# Patient Record
Sex: Male | Born: 1990 | Race: Black or African American | Hispanic: No | Marital: Single | Smoking: Current every day smoker
Health system: Southern US, Community
[De-identification: ages and names within clinical notes are randomized; demographics above are authoritative.]

---

## 2018-10-03 ENCOUNTER — Emergency Department (HOSPITAL_COMMUNITY): Payer: Self-pay

## 2018-10-03 ENCOUNTER — Emergency Department (HOSPITAL_COMMUNITY)
Admission: EM | Admit: 2018-10-03 | Discharge: 2018-10-03 | Disposition: A | Discharge status: Home / Self Care | Payer: Self-pay | Attending: Emergency Medicine | Admitting: Emergency Medicine

## 2018-10-03 ENCOUNTER — Encounter (HOSPITAL_COMMUNITY): Payer: Self-pay

## 2018-10-03 DIAGNOSIS — Z202 Contact with and (suspected) exposure to infections with a predominantly sexual mode of transmission: Secondary | ICD-10-CM | POA: Insufficient documentation

## 2018-10-03 DIAGNOSIS — N509 Disorder of male genital organs, unspecified: Secondary | ICD-10-CM | POA: Insufficient documentation

## 2018-10-03 DIAGNOSIS — N5089 Other specified disorders of the male genital organs: Secondary | ICD-10-CM

## 2018-10-03 DIAGNOSIS — M545 Low back pain, unspecified: Secondary | ICD-10-CM

## 2018-10-03 DIAGNOSIS — Z7689 Persons encountering health services in other specified circumstances: Secondary | ICD-10-CM

## 2018-10-03 DIAGNOSIS — F17200 Nicotine dependence, unspecified, uncomplicated: Secondary | ICD-10-CM | POA: Insufficient documentation

## 2018-10-03 LAB — URINALYSIS, ROUTINE W REFLEX MICROSCOPIC
Bacteria, UA: NONE SEEN
Bilirubin Urine: NEGATIVE
Glucose, UA: NEGATIVE mg/dL
Hgb urine dipstick: NEGATIVE
Ketones, ur: NEGATIVE mg/dL
Nitrite: NEGATIVE
Protein, ur: NEGATIVE mg/dL
Specific Gravity, Urine: 1.026 (ref 1.005–1.030)
pH: 7 (ref 5.0–8.0)

## 2018-10-03 MED ORDER — AZITHROMYCIN 250 MG PO TABS
1000.0000 mg | ORAL_TABLET | Freq: Once | ORAL | Status: AC
  Administered 2018-10-03: 1000 mg via ORAL
  Filled 2018-10-03: qty 4

## 2018-10-03 MED ORDER — KETOROLAC TROMETHAMINE 15 MG/ML IJ SOLN
15.0000 mg | Freq: Once | INTRAMUSCULAR | Status: AC
  Administered 2018-10-03: 15 mg via INTRAVENOUS
  Filled 2018-10-03: qty 1

## 2018-10-03 MED ORDER — METHOCARBAMOL 500 MG PO TABS: 500.0000 mg | ORAL_TABLET | Freq: Two times a day (BID) | ORAL | 0 refills | Status: AC

## 2018-10-03 MED ORDER — CEFTRIAXONE SODIUM 250 MG IJ SOLR
250.0000 mg | Freq: Once | INTRAMUSCULAR | Status: AC
  Administered 2018-10-03: 250 mg via INTRAMUSCULAR
  Filled 2018-10-03: qty 250

## 2018-10-03 MED ORDER — STERILE WATER FOR INJECTION IJ SOLN
INTRAMUSCULAR | Status: AC
  Administered 2018-10-03: 10 mL
  Filled 2018-10-03: qty 10

## 2018-10-03 NOTE — Discharge Instructions (Signed)
You have been diagnosed today with acute right-sided lower back pain, mass of the left testicle, concern for STD exposure.  At this time there does not appear to be the presence of an emergent medical condition, however there is always the potential for conditions to change. Please read and follow the below instructions.  Please return to the Emergency Department immediately for any new or worsening symptoms. Please be sure to follow up with your Primary Care Provider within one week regarding your visit today; please call their office to schedule an appointment even if you are feeling better for a follow-up visit. You have been treated with a medication today called Toradol this is an NSAID medication, please avoid further NSAIDs including ibuprofen, naproxen, Aleve, Advil for the next 2 days and drink plenty of water.  You may use the muscle relaxer Robaxin as prescribed to help with your muscle spasm, do not drive or operate machinery while taking Robaxin as will make you drowsy.  Do not drink alcohol or take other sedating medications with Robaxin as this will worsen side effects. Your ultrasound today showed a mass on your left testicle, please call the specialist at [facility] urology tomorrow to schedule a follow-up appointment, you will need a repeat ultrasound in approximately 1 month.  Call their office tomorrow to schedule a follow-up appointment. You have been treated presumptively today for gonorrhea and chlamydia. You have been tested today for gonorrhea and chlamydia as well as HIV and syphilis. These results will be available in approximately 3 days. You may check your MyChart account for results. Please inform all sexual partners of positive results and that they should be tested and treated as well. Please wait 2 weeks and be sure that you and your partners are symptom free before returning to sexual activity. Please use protection with every sexual encounter. Follow Up: Please followup with  your primary doctor in 3 days for discussion of your diagnoses and further evaluation after today's visit; if you do not have a primary care doctor use the resource guide provided to find one; Please return to the ER for worsening symptoms, high fevers or persistent vomiting.  Get help right away if: You develop new bowel or bladder control problems. You have unusual weakness or numbness in your arms or legs. You develop nausea or vomiting. You develop abdominal pain. You feel faint. You testicular pain or swelling Any new/concerning or worsening symptoms   Please read the additional information packets attached to your discharge summary.  Do not take your medicine if  develop an itchy rash, swelling in your mouth or lips, or difficulty breathing; call 911 and seek immediate emergency medical attention if this occurs.

## 2018-10-03 NOTE — ED Provider Notes (Signed)
[facility]-EMERGENCY DEPT Provider Note   CSN: [id number] Arrival date & time: 10/03/18  1129    History   Chief Complaint Chief Complaint  Patient presents with  . Back Pain    HPI Juan Price is a 28 y.o. male otherwise healthy presenting today with multiple complaints.  Patient's initial complaint is right-sided lower back pain that began this morning after he got out of bed.  Patient reports after rolling out of bed he had a throbbing right lower back pain constant severe worsened with movement and palpation and improved still.  Patient reports he has had similar pain in the past.  He denies any injury/trauma.  He denies any fever/chills, IV drug use, saddle area paresthesias, history of cancer, urinary retention, bowel/bladder incontinence or radiation of pain.  He denies any dysuria/hematuria or any additional concerns related to his back pain. - Patient's second complaint today is a lump that he found on his left testicle he states that he first noticed it 3-4 days ago and has never felt it in the past he states that is not painful to touch, he denies any injury or trauma of this area.  He denies any dysuria/hematuria or swelling of the scrotum.  Patient would like to be tested for STDs today as well.    HPI  History reviewed. No pertinent past medical history.  There are no active problems to display for this patient.   History reviewed. No pertinent surgical history.      Home Medications    Prior to Admission medications   Medication Sig Start Date End Date Taking? Authorizing Provider  methocarbamol (ROBAXIN) 500 MG tablet Take 1 tablet (500 mg total) by mouth 2 (two) times daily. 10/03/18   Bill SalinasMorelli, Sincere Berlanga A, PA-C    Family History History reviewed. No pertinent family history.  Social History Social History   Tobacco Use  . Smoking status: Current Every Day Smoker    Packs/day: 0.50  . Smokeless tobacco: Never Used  Substance  Use Topics  . Alcohol use: Not Currently  . Drug use: Not Currently     Allergies   Iodine and Shellfish allergy   Review of Systems Review of Systems  Constitutional: Negative.  Negative for chills and fever.  Genitourinary: Positive for scrotal swelling (Lump attached to left testicle). Negative for discharge, dysuria, hematuria and testicular pain.  Musculoskeletal: Positive for back pain. Negative for joint swelling, neck pain and neck stiffness.  Skin: Negative.  Negative for rash.  Neurological: Negative.  Negative for syncope, weakness, numbness and headaches.       Denies saddle area paresthesias Denies bowel/bladder incontinence Denies urinary retention  All other systems reviewed and are negative.    Physical Exam Updated Vital Signs BP 126/79 (BP Location: Right Arm)   Pulse (!) 50   Temp 98.6 F (37 C) (Oral)   Resp 16   Ht 6\' 1"  (1.854 m)   Wt 97.5 kg   SpO2 99%   BMI 28.37 kg/m   Physical Exam Constitutional:      General: He is not in acute distress.    Appearance: Normal appearance. He is not ill-appearing or diaphoretic.  HENT:     Head: Normocephalic and atraumatic. No raccoon eyes or Battle's sign.     Jaw: There is normal jaw occlusion. No trismus.     Right Ear: External ear normal.     Left Ear: External ear normal.     Nose: Rhinorrhea present. Rhinorrhea is  clear.     Right Nostril: No epistaxis.     Left Nostril: No epistaxis.     Mouth/Throat:     Mouth: Mucous membranes are moist.     Pharynx: Oropharynx is clear.  Eyes:     General: Vision grossly intact. Gaze aligned appropriately.     Extraocular Movements: Extraocular movements intact.     Conjunctiva/sclera: Conjunctivae normal.     Pupils: Pupils are equal, round, and reactive to light.  Neck:     Musculoskeletal: Normal range of motion and neck supple. No neck rigidity.     Trachea: Trachea and phonation normal. No tracheal tenderness or tracheal deviation.  Cardiovascular:      Rate and Rhythm: Normal rate and regular rhythm.     Pulses:          Radial pulses are 2+ on the right side and 2+ on the left side.       Dorsalis pedis pulses are 2+ on the right side and 2+ on the left side.     Heart sounds: Normal heart sounds.  Pulmonary:     Effort: Pulmonary effort is normal. No respiratory distress.     Breath sounds: Normal breath sounds and air entry. No decreased breath sounds or rhonchi.  Chest:     Chest wall: No deformity, tenderness or crepitus.  Abdominal:     General: Bowel sounds are normal. There is no distension.     Palpations: Abdomen is soft. There is no pulsatile mass.     Tenderness: There is no abdominal tenderness. There is no right CVA tenderness, left CVA tenderness, guarding or rebound.  Genitourinary:    Comments: Chaperone present during genital exam Martin NT.  No external genital lesions noted, no bumps on head of penis, specifically no vesicles concerning for herpes or chancre suggestive of syphilis.  No pain with palpation of the penis/glans, no discharge or urethritis noted.  Scrotum and testicles without erythema/swelling or tenderness to palpation. Small nodule present to left superior testicle. Cremasteric reflex intact bilaterally. No palpable hernia noted.  Musculoskeletal:       Back:     Comments: No midline C/T/L spinal tenderness to palpation no deformity, crepitus, or step-off noted. No sign of injury to the neck or back.  Muscle spasm noted right lower back, tender to palpation  Feet:     Right foot:     Protective Sensation: 3 sites tested. 3 sites sensed.     Left foot:     Protective Sensation: 3 sites tested. 3 sites sensed.  Skin:    General: Skin is warm and dry.     Capillary Refill: Capillary refill takes less than 2 seconds.  Neurological:     Mental Status: He is alert.     GCS: GCS eye subscore is 4. GCS verbal subscore is 5. GCS motor subscore is 6.     Comments: Speech is clear and goal  oriented, follows commands Major Cranial nerves without deficit, no facial droop Normal strength in upper and lower extremities bilaterally including dorsiflexion and plantar flexion, strong and equal grip strength DTRs 2+ bilateral patella, no clonus of the feet Sensation normal to light and sharp touch Moves extremities without ataxia, coordination intact Normal gait  Psychiatric:        Behavior: Behavior is cooperative.    ED Treatments / Results  Labs (all labs ordered are listed, but only abnormal results are displayed) Labs Reviewed  URINALYSIS, ROUTINE W REFLEX MICROSCOPIC -  Abnormal; Notable for the following components:      Result Value   Leukocytes,Ua TRACE (*)    All other components within normal limits  RPR  HIV ANTIBODY (ROUTINE TESTING W REFLEX)  GC/CHLAMYDIA PROBE AMP ([place]    EKG None  Radiology Dg Lumbar Spine Complete  Result Date: 10/03/2018 CLINICAL DATA:  Low back and right flank pain for 1 week. The patient reports a knot in the region of pain. No known injury. EXAM: LUMBAR SPINE - COMPLETE 4+ VIEW COMPARISON:  None. FINDINGS: There is no evidence of lumbar spine fracture. Alignment is normal. Intervertebral disc spaces are maintained. IMPRESSION: Normal exam. Electronically Signed   By: Drusilla Kannerhomas  Dalessio M.D.   On: 10/03/2018 12:33   [place] Scrotum W/doppler  Result Date: 10/03/2018 CLINICAL DATA:  Left testicular mass EXAM: SCROTAL ULTRASOUND DOPPLER ULTRASOUND OF THE TESTICLES TECHNIQUE: Complete ultrasound examination of the testicles, epididymis, and other scrotal structures was performed. Color and spectral Doppler ultrasound were also utilized to evaluate blood flow to the testicles. COMPARISON:  None. FINDINGS: Right testicle Measurements: 4.7 x 2.1 x 2.6 cm. No mass or microlithiasis visualized. Left testicle Measurements: 4.1 x 1.7 x 2.7 cm. No mass or microlithiasis visualized. Right epididymis:  Normal in size and appearance. Left  epididymis: 2.8 x 1.3 x 1.3 cm extratesticular heterogeneous soft tissue mass along the posterior margin of the testicle, adjacent to versus contiguous with the epididymal tail. Differential considerations include leiomyoma, epididymal adenomatoid tumor, or fibrous pseudotumor versus less likely malignant lesion. Hydrocele:  Bilateral hydroceles, left greater than right. Varicocele:  None visualized. Pulsed Doppler interrogation of both testes demonstrates normal low resistance arterial and venous waveforms bilaterally. IMPRESSION: 1. 2.8 x 1.3 x 1.3 cm extratesticular heterogeneous soft tissue mass along the posterior margin of the testicle, adjacent to versus contiguous with the epididymal tail. Differential considerations include leiomyoma, epididymal adenomatoid tumor, or fibrous pseudotumor versus less likely malignant lesion. Urology consultation recommended. 2. No testicular torsion. Electronically Signed   By: Elige KoHetal  Patel   On: 10/03/2018 14:32    Procedures Procedures (including critical care time)  Medications Ordered in ED Medications  sterile water (preservative free) injection (has no administration in time range)  ketorolac (TORADOL) 15 MG/ML injection 15 mg (15 mg Intravenous Given 10/03/18 1329)  azithromycin (ZITHROMAX) tablet 1,000 mg (1,000 mg Oral Given 10/03/18 1602)  cefTRIAXone (ROCEPHIN) injection 250 mg (250 mg Intramuscular Given 10/03/18 1602)     Initial Impression / Assessment and Plan / ED Course  I have reviewed the triage vital signs and the nursing notes.  Pertinent labs & imaging results that were available during my care of the patient were reviewed by me and considered in my medical decision making (see chart for details).  Clinical Course as of Oct 02 1601  Tue Oct 03, 2018  1459 Outpatient follow-up with Urology; repeat US in one month.    [BM]    Clinical Course User Index [BM] Bill SalinasMorelli, Sharalyn Lomba A, PA-C   Juan Price is a 28 y.o. male presenting  with right-sided back pain.  He describes as his typical back pain that he has had for multiple years. Patient denies history of trauma, fever, IV drug use, night sweats, weight loss, cancer, saddle anesthesia, urinary rentention, bowel/bladder incontinence. No neurological deficits and normal neuro exam.  Patient with palpable muscle spasm of the right lower back, suspect musculoskeletal etiology of patient's pain. Pain is consistently reproducible with palpation of the back musculature. Abdomen  soft/nontender and without pulsatile mass. Patient with equal pedal pulses. Doubt spinal epidural abscess, cauda equina or AAA or other emergent etiologies at this time.  Imaging of right lower back obtained due to patient concern and duration pain as it has been intermittent for multiple years.  DG Lumbar:  IMPRESSION:  Normal exam.   Patient treated with Toradol 15 mg intramuscular, denies CKD or gastric ulcers, reports improvement of pain.  Will prescribe Robaxin 500 mg twice daily for muscular spasm, encourage warm compresses.  Patient has been advised on muscle relaxer precautions and he states understanding. Patient is ambulatory in the emergency department without assistance. RICE protocol and pain medicine indicated and discussed with patient.  - As to patient's left testicular lump, it is painless and ultrasound with Doppler reveals demonstrates normal low resistance arterial and venous waveforms bilaterally. IMPRESSION: 1. 2.8 x 1.3 x 1.3 cm extratesticular heterogeneous soft tissue mass along the posterior margin of the testicle, adjacent to versus contiguous with the epididymal tail. Differential considerations include leiomyoma, epididymal adenomatoid tumor, or fibrous pseudotumor versus less likely malignant lesion. Urology consultation recommended. 2. No testicular torsion.  I discussed these findings with on-call urologist who advises patient to call their office tomorrow to schedule a follow-up  appointment, patient will need repeat ultrasound in approximately 1 month.  Referral and contact information given for [facility]. - Finally patient requested to be tested for STDs today, HIV/syphilis and GC chlamydia urine probe were obtained, and he is asymptomatic and denies any recent sexual contacts in the last 1-2 months request treatment.  Urinalysis shows 11-20 white blood cells and trace leukocytes, no trichomonas seen.  Will treat with Rocephin and azithromycin and encourage PCP follow-up. Patient advised to inform and treat all sexual partners.  Pt advised on safe sex practices and understands that they have GC/Chlamydia cultures pending and will result in 2-3 days. HIV and RPR sent. Patient encouraged to follow up at local health department for future STI checks. No concern for prostatitis or epididymitis at this time. Discussed return precautions. Pt appears safe for discharge.  - At this time there does not appear to be any evidence of an acute emergency medical condition and the patient appears stable for discharge with appropriate outpatient follow up. Diagnosis was discussed with patient who verbalizes understanding of care plan and is agreeable to discharge. I have discussed return precautions with patient who verbalizes understanding of return precautions. Patient encouraged to follow-up with their PCP and urology. All questions answered.  Note: Portions of this report may have been transcribed using voice recognition software. Every effort was made to ensure accuracy; however, inadvertent computerized transcription errors may still be present. Final Clinical Impressions(s) / ED Diagnoses   Final diagnoses:  Acute right-sided low back pain without sciatica  Mass of left testicle  Encounter for assessment of sexually transmitted disease exposure    ED Discharge Orders         Ordered    methocarbamol (ROBAXIN) 500 MG tablet  2 times daily     10/03/18 1534            Elizabeth PalauMorelli, Syona Wroblewski A, PA-C 10/03/18 1613    Pricilla LovelessGoldston, Scott, MD 10/03/18 2155

## 2018-10-03 NOTE — ED Triage Notes (Signed)
Patient arrived via [facility] from home.    C/o of knott on lower mid back after waking up this morning.  10/10 pain      Hx. Pinched nerve.   Per ems "some what ambulatory due to pain".

## 2018-10-03 NOTE — ED Notes (Signed)
Bed: WTR6 Expected date:  Expected time:  Means of arrival:  Comments: 

## 2018-10-04 LAB — RPR: RPR Ser Ql: NONREACTIVE

## 2018-10-04 LAB — HIV ANTIBODY (ROUTINE TESTING W REFLEX): HIV Screen 4th Generation wRfx: NONREACTIVE

## 2019-12-31 IMAGING — CR LUMBAR SPINE - COMPLETE 4+ VIEW
5 series · 5 of 5 positions shown · non-contrast
Comparison: None.

CLINICAL DATA: Low back and right flank pain for 1 week. The
patient reports a knot in the region of pain. No known injury.

EXAM:
LUMBAR SPINE - COMPLETE 4+ VIEW

[t lumbar spine ap]
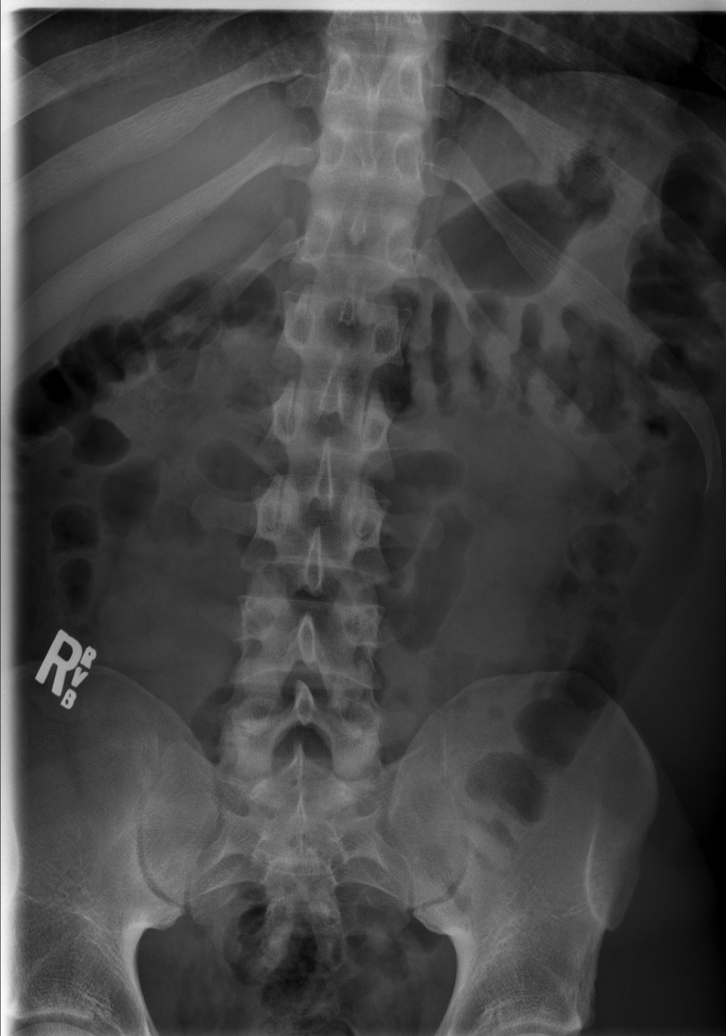

[t lumbar spine obl (1 of 2)]
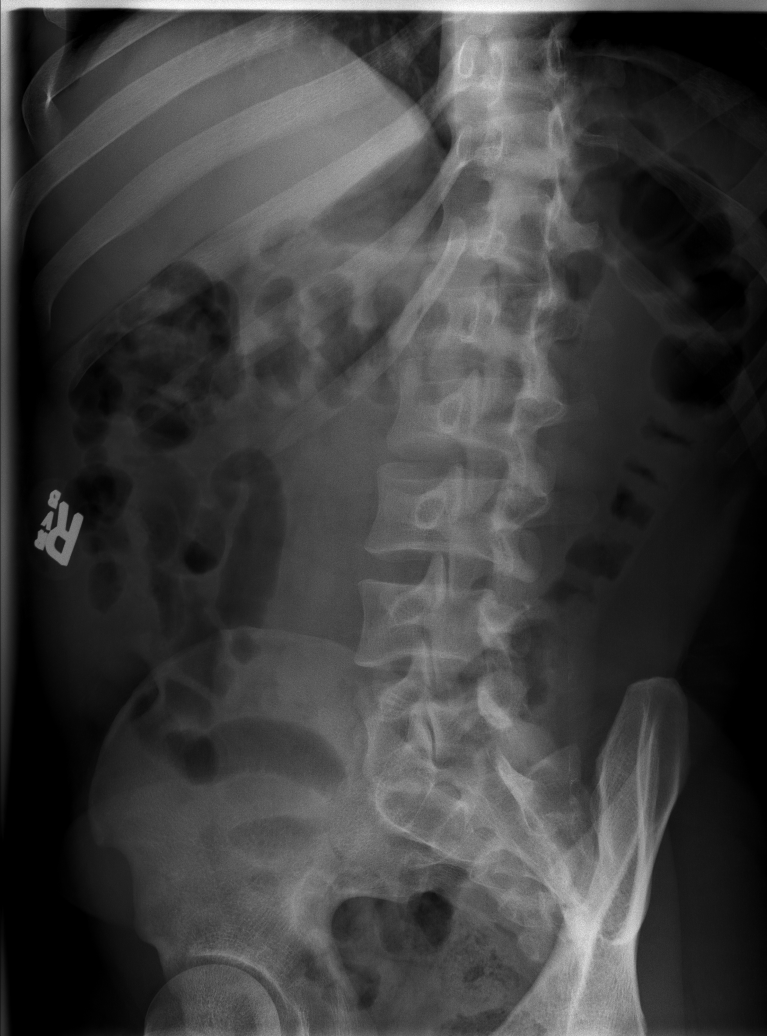

[t lumbar spine obl (2 of 2)]
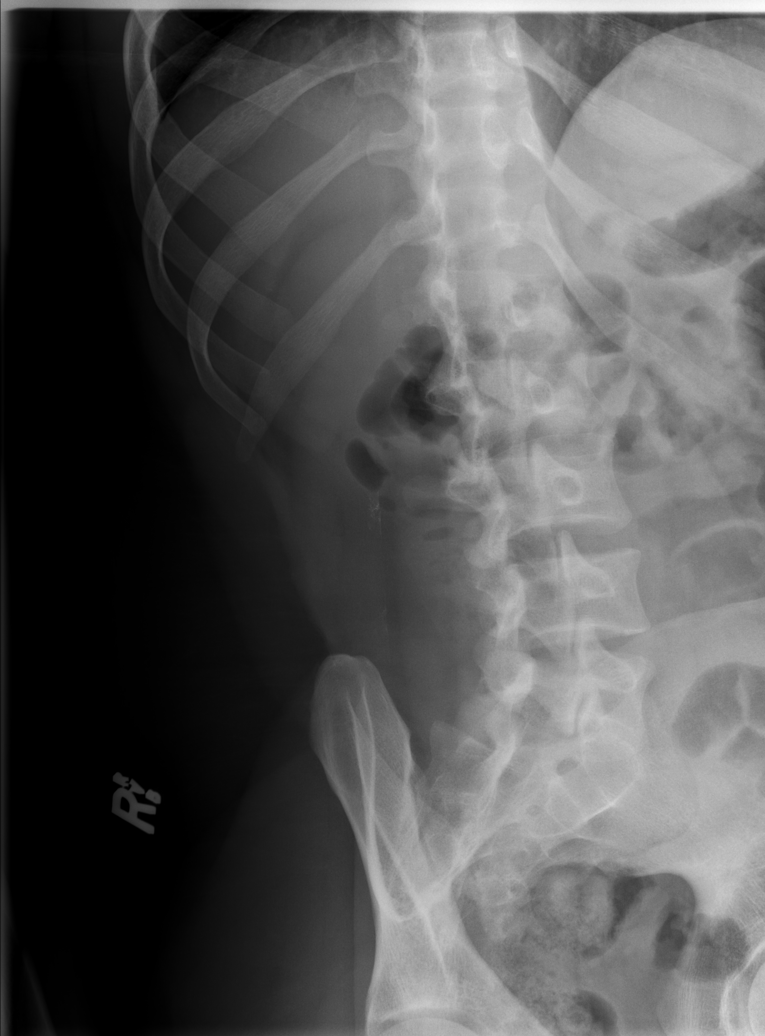

[t lumbar spine lat]
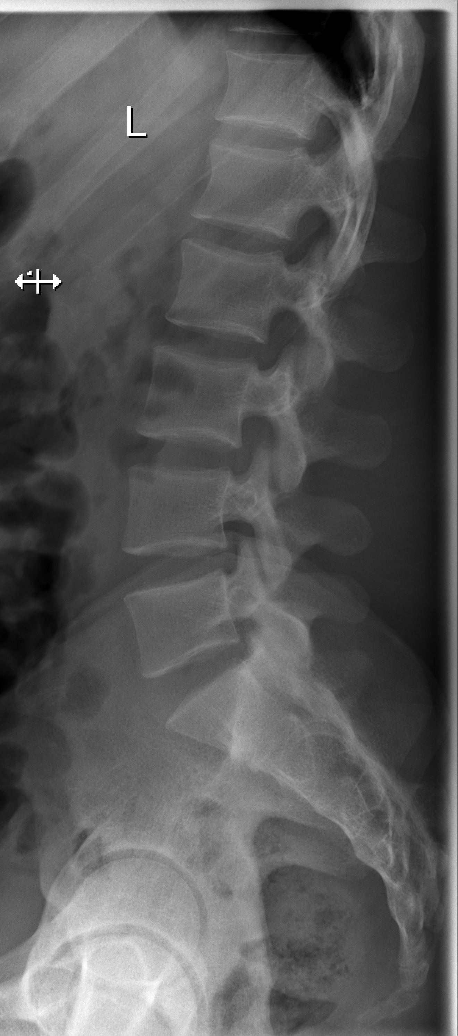

[t lumbar l-5 s-1 spot]
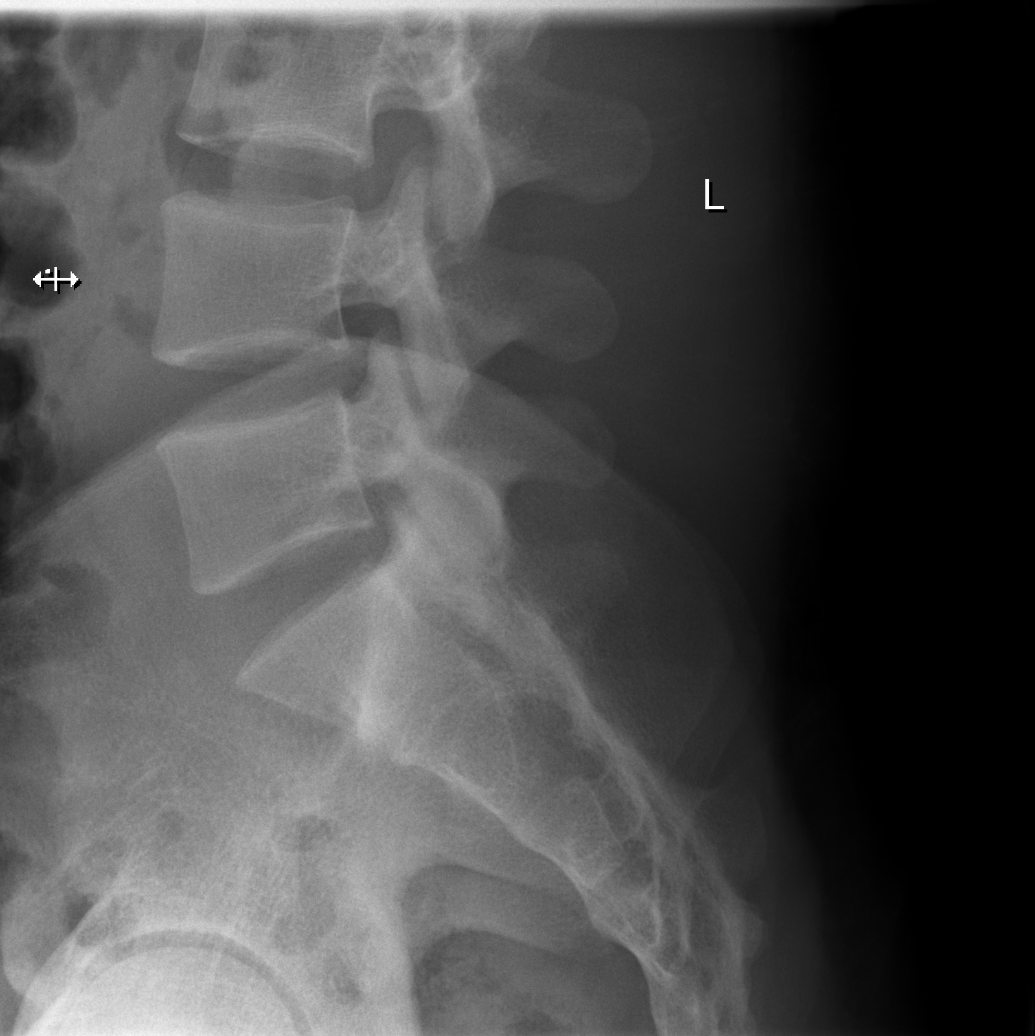

[5 of 5 positions shown; findings below may reference images not displayed]

FINDINGS: There is no evidence of lumbar spine fracture. Alignment is normal.
Intervertebral disc spaces are maintained.
IMPRESSION: Normal exam.
